# Patient Record
Sex: Female | Born: 1960 | Race: White | Hispanic: No | Marital: Married | Smoking: Current every day smoker
Health system: Southern US, Community
[De-identification: ages and names within clinical notes are randomized; demographics above are authoritative.]

## PROBLEM LIST (undated history)

## (undated) HISTORY — PX: ABDOMINAL HYSTERECTOMY: SHX81

---

## 1998-10-05 ENCOUNTER — Other Ambulatory Visit: Admission: RE | Admit: 1998-10-05 | Discharge: 1998-10-05 | Payer: Self-pay | Admitting: Obstetrics and Gynecology

## 1999-10-26 ENCOUNTER — Other Ambulatory Visit: Admission: RE | Admit: 1999-10-26 | Discharge: 1999-10-26 | Payer: Self-pay | Admitting: Obstetrics and Gynecology

## 2001-02-28 ENCOUNTER — Other Ambulatory Visit: Admission: RE | Admit: 2001-02-28 | Discharge: 2001-02-28 | Payer: Self-pay | Admitting: Obstetrics and Gynecology

## 2002-12-04 ENCOUNTER — Other Ambulatory Visit: Admission: RE | Admit: 2002-12-04 | Discharge: 2002-12-04 | Payer: Self-pay | Admitting: Obstetrics and Gynecology

## 2002-12-09 ENCOUNTER — Encounter: Payer: Self-pay | Admitting: Obstetrics and Gynecology

## 2002-12-09 ENCOUNTER — Encounter (INDEPENDENT_AMBULATORY_CARE_PROVIDER_SITE_OTHER): Payer: Self-pay | Admitting: *Deleted

## 2002-12-09 ENCOUNTER — Encounter: Admission: RE | Admit: 2002-12-09 | Discharge: 2002-12-09 | Payer: Self-pay | Admitting: Obstetrics and Gynecology

## 2004-02-07 ENCOUNTER — Other Ambulatory Visit: Admission: RE | Admit: 2004-02-07 | Discharge: 2004-02-07 | Payer: Self-pay | Admitting: Obstetrics and Gynecology

## 2004-02-10 ENCOUNTER — Encounter: Admission: RE | Admit: 2004-02-10 | Discharge: 2004-02-10 | Payer: Self-pay | Admitting: Obstetrics and Gynecology

## 2004-03-09 ENCOUNTER — Encounter (INDEPENDENT_AMBULATORY_CARE_PROVIDER_SITE_OTHER): Payer: Self-pay | Admitting: *Deleted

## 2004-03-09 ENCOUNTER — Ambulatory Visit (HOSPITAL_COMMUNITY): Admission: RE | Admit: 2004-03-09 | Discharge: 2004-03-09 | Payer: Self-pay | Admitting: General Surgery

## 2004-03-09 ENCOUNTER — Ambulatory Visit (HOSPITAL_BASED_OUTPATIENT_CLINIC_OR_DEPARTMENT_OTHER): Admission: RE | Admit: 2004-03-09 | Discharge: 2004-03-09 | Payer: Self-pay | Admitting: General Surgery

## 2005-02-16 ENCOUNTER — Other Ambulatory Visit: Admission: RE | Admit: 2005-02-16 | Discharge: 2005-02-16 | Payer: Self-pay | Admitting: Obstetrics and Gynecology

## 2005-04-17 ENCOUNTER — Encounter: Admission: RE | Admit: 2005-04-17 | Discharge: 2005-04-17 | Payer: Self-pay | Admitting: Obstetrics and Gynecology

## 2005-04-21 ENCOUNTER — Encounter: Admission: RE | Admit: 2005-04-21 | Discharge: 2005-04-21 | Payer: Self-pay | Admitting: Interventional Radiology

## 2005-05-04 ENCOUNTER — Ambulatory Visit: Admission: RE | Admit: 2005-05-04 | Discharge: 2005-05-04 | Payer: Self-pay | Admitting: Gynecology

## 2005-05-15 ENCOUNTER — Encounter (INDEPENDENT_AMBULATORY_CARE_PROVIDER_SITE_OTHER): Payer: Self-pay | Admitting: Specialist

## 2005-05-15 ENCOUNTER — Inpatient Hospital Stay (HOSPITAL_COMMUNITY): Admission: RE | Admit: 2005-05-15 | Discharge: 2005-05-17 | Payer: Self-pay | Admitting: Obstetrics and Gynecology

## 2007-04-16 ENCOUNTER — Encounter: Admission: RE | Admit: 2007-04-16 | Discharge: 2007-04-16 | Payer: Self-pay | Admitting: Obstetrics and Gynecology

## 2010-11-10 NOTE — Consult Note (Signed)
[name], [name]             ACCOUNT NO.:  [id number]   MEDICAL RECORD NO.:  [id number]          PATIENT TYPE:  OUT   LOCATION:  GYN                          FACILITY:  [facility]   PHYSICIAN:  De Blanch, M.D.DATE OF BIRTH:  1961/02/01   DATE OF CONSULTATION:  05/04/2005  DATE OF DISCHARGE:                                   CONSULTATION   GYNECOLOGIC ONCOLOGY CLINIC   REFERRING PHYSICIAN:  Juluis Mire, M.D.   CHIEF COMPLAINT:  Pelvic mass.   HISTORY OF PRESENT ILLNESS:  This is a 50 year old, white married female  seen in consultation at the request of Dr. Richardean Chimera regarding management  of large mass arising at the patient's uterus.  The patient gives a history  of approximately 1 year of pelvic pressure and 6 months of increasing  menorrhagia.  She was initially evaluated with an ultrasound.  She  subsequently was referred for uterine artery embolization.  However, an MRI  obtained in preparation for the embolization revealed a 12.4 x 10.3 x 7.3 cm  heterogeneous mass arising from the uterus which is atypical for a fibroid.  The possibility of a uterine malignancy or sarcoma was raised and  hysterectomy has been advised.   PAST MEDICAL HISTORY:  None.   PAST SURGICAL HISTORY:  1.  Right salpingo-oophorectomy for ovarian cyst during pregnancy.  2.  Breast biopsy for benign lipoma.   ALLERGIES:  PENICILLIN causes a rash.   CURRENT MEDICATIONS:  Zoloft and Ambien p.r.n.   PAST OBSTETRICAL HISTORY:  G2.  The patient has a 49 year old and a 13-year-  old.   SOCIAL HISTORY:  The patient is married.  The patient smokes less than 1  pack per day.  She works in Clinical biochemist at [employer].   FAMILY HISTORY:  The patient has a sister with metastatic melanoma.   REVIEW OF SYSTEMS:  A 10-point comprehensive review of systems is performed  and is negative except for symptoms as noted above.   PHYSICAL EXAMINATION:  VITAL SIGNS:  Weight 152 pounds,  height 5 feet 7,  blood pressure 128/74, pulse 88.  GENERAL:  The patient is a healthy, slightly anxious, white female in no  acute distress.  HEENT:  Negative.  NECK:  Supple without thyromegaly.  There is no supraclavicular or inguinal  adenopathy.  ABDOMEN:  Soft and nontender.  She has a palpable mass extending  approximately 2 cm below the umbilicus.  She has a well-healed Pfannenstiel  incision.  PELVIC:  EG/BUS.  Vagina, bladder and urethra are normal.  Cervix is  deviated anteriorly.  There is a central pelvic mass consistent with uterine  fibroid or enlarged uterus approximately 16 weeks' gestational size.  No  adnexal mass is noted.  Rectovaginal exam confirms.  EXTREMITIES:  Lower extremities without edema or varicosities.   IMPRESSION:  Atypical mass arising from the uterus either atypical fibroid  or uterine sarcoma.  I had a lengthy discussion with the patient and her  husband regarding the differential diagnosis.   RECOMMENDATIONS:  We would recommend she undergo a total abdominal  hysterectomy and intraoperative  frozen section.  They are aware that if this  is a sarcoma, additional surgical staging may be recommended or if there is  any evidence of spread resection which could include bowel resection would  be advised.  They are desirous of going ahead with surgery.  The risks of  surgery including hemorrhage, infection, injury to adjacent viscera,  thromboembolic complications and anesthetic risks were outlined.  All other  questions are answered and we will coordinate scheduling surgery with Dr.  Jonny Ruiz McComb's office.      De Blanch, M.D.  Electronically Signed     DC/MEDQ  D:  05/04/2005  T:  05/04/2005  Job:  [id number]   cc:   Juluis Mire, M.D.  Fax: [phone]   Telford Nab, R.N.  [address]  [place], [place] [address]

## 2010-11-10 NOTE — Discharge Summary (Signed)
NAMESKYELYN, [name]             ACCOUNT NO.:  [id number]   MEDICAL RECORD NO.:  [id number]          PATIENT TYPE:  INP   LOCATION:  1614                         FACILITY:  [facility]   PHYSICIAN:  Juluis Mire, M.D.   DATE OF BIRTH:  1961-05-19   DATE OF ADMISSION:  05/15/2005  DATE OF DISCHARGE:  05/17/2005                                 DISCHARGE SUMMARY   ADMISSION DIAGNOSIS:  Enlarged uterine fibroids, rule out sarcomatous  changes.   DISCHARGE DIAGNOSIS:  Apparent degenerating fibroid.   PROCEDURE:  Total abdominal hysterectomy.   HISTORY OF PRESENT ILLNESS:  For complete History and Physical, please see  the dictated note.   HOSPITAL COURSE:  The patient underwent total abdominal hysterectomy.  Initial evaluation revealed generating fibroid.  Final pathology is pending.  Postop hemoglobin 11.8.  Discharged home on postop day #2.  At that time,  she was afebrile with stable vital signs.  Abdomen was soft and nontender  with incision intact.  She was voiding without difficulty and passing  flatus.  No active vaginal bleeding.   CONDITION ON DISCHARGE:  The patient was discharged home in stable  condition.   DISPOSITION:  Routine postop instruction orders given.   ACTIVITY:  She is to avoid heavy lifting, vaginal entrance or driving of a  car.   SPECIAL INSTRUCTIONS:  She is to watch for signs of infection, nausea,  vomiting, increasing abdominal pain or active vaginal bleeding.   FOLLOW UP:  Follow up in the office in 1 week.      Juluis Mire, M.D.  Electronically Signed     JSM/MEDQ  D:  05/17/2005  T:  05/17/2005  Job:  [id number]

## 2010-11-10 NOTE — Op Note (Signed)
NAME:  [name], [name]                       ACCOUNT NO.:  [id number]   MEDICAL RECORD NO.:  [id number]                   PATIENT TYPE:  AMB   LOCATION:  DSC                                  FACILITY:  [facility]   PHYSICIAN:  Rose Phi. Maple Hudson, M.D.                DATE OF BIRTH:  03/21/1961   DATE OF PROCEDURE:  03/09/2004  DATE OF DISCHARGE:                                 OPERATIVE REPORT   PREOPERATIVE DIAGNOSES:  1.  Fibroadenoma of the left breast.  2.  Small cyst of the left breast.   POSTOPERATIVE DIAGNOSES:  1.  Fibroadenoma of the left breast.  2.  Small cyst of the left breast.   OPERATION PERFORMED:  1.  Excision of left breast mass.  2.  Excision of cyst of the face.   SURGEON:  Rose Phi. Maple Hudson, M.D.   ANESTHESIA:  General.   DESCRIPTION OF PROCEDURE:  After suitable general anesthesia was induced,  the patient was placed in the supine position and the left breast prepped  and draped in the usual fashion.  We also used a local anesthetic mixture  and I injected that as we made a circumareolar incision centered at the 12  o'clock position for the palpable mass that was at 12 o'clock on the left  side.  After making the incision, I exposed the fibroadenoma and excised it.  Hemostasis obtained with the cautery.  Subcuticular closure with 4-0  Monocryl and Steri-Strips carried out.  Dressing applied.   We then prepped the little area of the face where the cyst was lateral to  the mouth and a small incision was made to excise it.  This gave about a 1.5  x 0.5 cm defect and it was closed in a single layer of interrupted nylon  sutures.  Dressings were applied.  The patient was then transferred to the  recovery room in satisfactory condition, having tolerated the procedure  well.                                               Rose Phi. Maple Hudson, M.D.    PRY/MEDQ  D:  03/09/2004  T:  03/09/2004  Job:  [id number]

## 2010-11-10 NOTE — Op Note (Signed)
NAMEKELSEE, PRESLAR             ACCOUNT NO.:  [id number]   MEDICAL RECORD NO.:  [id number]          PATIENT TYPE:  INP   LOCATION:  1614                         FACILITY:  [facility]   PHYSICIAN:  De Blanch, M.D.DATE OF BIRTH:  Jun 13, 1961   DATE OF PROCEDURE:  05/15/2005  DATE OF DISCHARGE:                                 OPERATIVE REPORT   PREOPERATIVE DIAGNOSIS:  Uterine fibroids which are rapidly enlarging, rule  out uterine sarcoma.   POSTOPERATIVE DIAGNOSIS:  Fibroid uterus (pending final pathology).   PROCEDURE:  Exploratory laparotomy, total abdominal hysterectomy, right  salpingo-oophorectomy.   SURGEON:  De Blanch, M.D.   FIRST ASSISTANT:  Juluis Mire, M.D., Telford Nab, R.N.   ANESTHESIA:  General orotracheal tube.   ESTIMATED BLOOD LOSS:  125 cc.   SURGICAL FINDINGS:  At time of exploratory laparotomy, the upper abdomen was  normal. The uterus was enlarged approximately [redacted] weeks gestational size and  was very soft. The left tube and ovary appeared normal. The right tube  appeared normal. The right ovary had previously been removed. On frozen  section, we were told this was a benign uterine fibroid with no increased  number of mitoses.   PROCEDURE:  The patient brought to the operating room, and after  satisfactory attainment of general anesthesia, was placed in modified  lithotomy position in Nimmons stirrups. Anterior abdominal wall, perineum and  vagina were prepped with Betadine. A Foley catheter was inserted. The  patient was draped. The abdomen was entered through a previous Pfannenstiel  incision. Peritoneal washings were obtained from the pelvis. The upper  abdomen and pelvis were explored with the above-noted findings. Bookwalter  retractor was positioned with care taken to avoid compression of the psoas  muscle and femoral nerve. The bowel was packed out of the pelvis. The round  ligament was grasped with a Kelly clamp and  divided. The retroperitoneal  space on the right was opened, identifying the vessels and ureter. The  ovarian vessels were skeletonized, clamped, cut, free tied and suture  ligated. The peritoneum of the posterior aspect of the broad ligament was  further incised.   The left round ligament was grasped large Kelly clamp, divided and left  retroperitoneal space opened. The ureter was identified. The ovarian vessels  were identified. The patient desired to preserve her ovary, and therefore,  the uterine ovarian anastomosis, the ovarian ligament, and fallopian tube  were cross clamped and divided, suture ligated and free tied, thus  preserving the left tube and ovary. The left posterior broad ligament was  further incised. Bladder flap was incised and advanced with sharp and blunt  dissection. Uterine vessels were skeletonized, clamped, cut and suture  ligated in a stepwise fashion. The paracervical and cardinal ligaments were  clamped, cut and suture ligated. The vaginal angles were cross clamped and  divided. The cervix was transected from its junction with the vagina. The  uterus, cervix and the right fallopian tube were submitted to pathology for  frozen section with the above-noted findings. The vaginal angles were  transfixed with 0 Vicryl. Central portion of vagina closed with  interrupted  figure-of-eight sutures of 0 Vicryl. The pelvis was irrigated and found to  be hemostatic. Frozen section returned indicating a benign ovarian benign  fibroid uterus. The retractors and packs were removed. The anterior  abdominal wall was closed in layers, the first being a running suture of 0  Vicryl on the peritoneum. The subfascial area and rectus muscle were  inspected and found to be hemostatic. The fascia was closed with a running  suture of #1 PDS. Subcutaneous tissue was irrigated, and skin closed with  skin staples. A dressing was applied. The patient was awakened from  anesthesia and taken  to the recovery room in satisfactory condition. Sponge,  needle and instrument counts correct x2.      De Blanch, M.D.  Electronically Signed     DC/MEDQ  D:  05/15/2005  T:  05/15/2005  Job:  [id number]   cc:   Telford Nab, R.N.  [address]  [place], [place] [address]   Juluis Mire, M.D.  Fax: [phone]

## 2011-05-29 ENCOUNTER — Other Ambulatory Visit: Payer: Self-pay | Admitting: Obstetrics and Gynecology

## 2011-05-29 DIAGNOSIS — N63 Unspecified lump in unspecified breast: Secondary | ICD-10-CM

## 2011-06-12 ENCOUNTER — Ambulatory Visit
Admission: RE | Admit: 2011-06-12 | Discharge: 2011-06-12 | Disposition: A | Discharge status: Home / Self Care | Payer: 59 | Source: Ambulatory Visit | Attending: Obstetrics and Gynecology | Admitting: Obstetrics and Gynecology

## 2011-06-12 DIAGNOSIS — N63 Unspecified lump in unspecified breast: Secondary | ICD-10-CM

## 2013-09-17 ENCOUNTER — Other Ambulatory Visit: Payer: Self-pay | Admitting: Obstetrics and Gynecology

## 2013-09-17 DIAGNOSIS — R928 Other abnormal and inconclusive findings on diagnostic imaging of breast: Secondary | ICD-10-CM

## 2013-09-24 ENCOUNTER — Ambulatory Visit
Admission: RE | Admit: 2013-09-24 | Discharge: 2013-09-24 | Disposition: A | Discharge status: Home / Self Care | Payer: PRIVATE HEALTH INSURANCE | Source: Ambulatory Visit | Attending: Obstetrics and Gynecology | Admitting: Obstetrics and Gynecology

## 2013-09-24 DIAGNOSIS — R928 Other abnormal and inconclusive findings on diagnostic imaging of breast: Secondary | ICD-10-CM

## 2014-10-27 ENCOUNTER — Other Ambulatory Visit: Payer: Self-pay | Admitting: Obstetrics and Gynecology

## 2014-10-27 DIAGNOSIS — N6320 Unspecified lump in the left breast, unspecified quadrant: Principal | ICD-10-CM

## 2014-10-27 DIAGNOSIS — N6310 Unspecified lump in the right breast, unspecified quadrant: Secondary | ICD-10-CM

## 2014-11-04 ENCOUNTER — Ambulatory Visit
Admission: RE | Admit: 2014-11-04 | Discharge: 2014-11-04 | Disposition: A | Discharge status: Home / Self Care | Payer: PRIVATE HEALTH INSURANCE | Source: Ambulatory Visit | Attending: Obstetrics and Gynecology | Admitting: Obstetrics and Gynecology

## 2014-11-04 DIAGNOSIS — N6310 Unspecified lump in the right breast, unspecified quadrant: Secondary | ICD-10-CM

## 2014-11-04 DIAGNOSIS — N6320 Unspecified lump in the left breast, unspecified quadrant: Principal | ICD-10-CM

## 2018-11-28 ENCOUNTER — Emergency Department (HOSPITAL_COMMUNITY): Payer: 59

## 2018-11-28 ENCOUNTER — Emergency Department (HOSPITAL_COMMUNITY)
Admission: EM | Admit: 2018-11-28 | Discharge: 2018-11-28 | Disposition: A | Discharge status: Home / Self Care | Payer: 59 | Attending: Emergency Medicine | Admitting: Emergency Medicine

## 2018-11-28 ENCOUNTER — Encounter (HOSPITAL_COMMUNITY): Payer: Self-pay

## 2018-11-28 ENCOUNTER — Other Ambulatory Visit: Payer: Self-pay

## 2018-11-28 DIAGNOSIS — K921 Melena: Secondary | ICD-10-CM | POA: Diagnosis not present

## 2018-11-28 DIAGNOSIS — F17210 Nicotine dependence, cigarettes, uncomplicated: Secondary | ICD-10-CM | POA: Insufficient documentation

## 2018-11-28 DIAGNOSIS — R197 Diarrhea, unspecified: Secondary | ICD-10-CM | POA: Diagnosis not present

## 2018-11-28 DIAGNOSIS — R112 Nausea with vomiting, unspecified: Secondary | ICD-10-CM | POA: Insufficient documentation

## 2018-11-28 DIAGNOSIS — K529 Noninfective gastroenteritis and colitis, unspecified: Secondary | ICD-10-CM | POA: Diagnosis not present

## 2018-11-28 DIAGNOSIS — K625 Hemorrhage of anus and rectum: Secondary | ICD-10-CM | POA: Diagnosis present

## 2018-11-28 LAB — COMPREHENSIVE METABOLIC PANEL
ALT: 17 U/L (ref 0–44)
AST: 17 U/L (ref 15–41)
Albumin: 4.4 g/dL (ref 3.5–5.0)
Alkaline Phosphatase: 79 U/L (ref 38–126)
Anion gap: 6 (ref 5–15)
BUN: 13 mg/dL (ref 6–20)
CO2: 26 mmol/L (ref 22–32)
Calcium: 9.3 mg/dL (ref 8.9–10.3)
Chloride: 104 mmol/L (ref 98–111)
Creatinine, Ser: 0.66 mg/dL (ref 0.44–1.00)
GFR calc Af Amer: >60 mL/min (ref >60)
GFR calc non Af Amer: >60 mL/min (ref >60)
Glucose, Bld: 99 mg/dL (ref 70–99)
Potassium: 3.5 mmol/L (ref 3.5–5.1)
Sodium: 136 mmol/L (ref 135–145)
Total Bilirubin: 0.7 mg/dL (ref 0.3–1.2)
Total Protein: 7.6 g/dL (ref 6.5–8.1)

## 2018-11-28 LAB — CBC WITH DIFFERENTIAL/PLATELET
Abs Immature Granulocytes: 0.02 10*3/uL (ref 0.00–0.07)
Basophils Absolute: 0.1 10*3/uL (ref 0.0–0.1)
Basophils Relative: 1 %
Eosinophils Absolute: 0.1 10*3/uL (ref 0.0–0.5)
Eosinophils Relative: 1 %
HCT: 42.8 % (ref 36.0–46.0)
Hemoglobin: 14.2 g/dL (ref 12.0–15.0)
Immature Granulocytes: 0 %
Lymphocytes Relative: 28 %
Lymphs Abs: 2.6 10*3/uL (ref 0.7–4.0)
MCH: 32 pg (ref 26.0–34.0)
MCHC: 33.2 g/dL (ref 30.0–36.0)
MCV: 96.4 fL (ref 80.0–100.0)
Monocytes Absolute: 0.6 10*3/uL (ref 0.1–1.0)
Monocytes Relative: 7 %
Neutro Abs: 5.9 10*3/uL (ref 1.7–7.7)
Neutrophils Relative %: 63 %
Platelets: 301 10*3/uL (ref 150–400)
RBC: 4.44 MIL/uL (ref 3.87–5.11)
RDW: 12.8 % (ref 11.5–15.5)
WBC: 9.3 10*3/uL (ref 4.0–10.5)
nRBC: 0 % (ref 0.0–0.2)

## 2018-11-28 LAB — LIPASE, BLOOD: Lipase: 60 U/L — ABNORMAL HIGH (ref 11–51)

## 2018-11-28 MED ORDER — IOPAMIDOL (ISOVUE-300) INJECTION 61%
INTRAVENOUS | Status: AC
  Filled 2018-11-28: qty 150

## 2018-11-28 MED ORDER — SODIUM CHLORIDE (PF) 0.9 % IJ SOLN
INTRAMUSCULAR | Status: AC
  Filled 2018-11-28: qty 50

## 2018-11-28 MED ORDER — IOHEXOL 300 MG/ML  SOLN
100.0000 mL | Freq: Once | INTRAMUSCULAR | Status: AC | PRN
  Administered 2018-11-28: 100 mL via INTRAVENOUS

## 2018-11-28 MED ORDER — SODIUM CHLORIDE 0.9 % IV BOLUS
500.0000 mL | Freq: Once | INTRAVENOUS | Status: AC
  Administered 2018-11-28: 500 mL via INTRAVENOUS

## 2018-11-28 NOTE — ED Notes (Signed)
Discharge instructions reviewed with patient. Patient verbalizes understanding and has no questions at this time.

## 2018-11-28 NOTE — ED Notes (Signed)
Patient transported to CT 

## 2018-11-28 NOTE — ED Triage Notes (Signed)
Pt states she woke up at 0130 Thursday with "violent" emesis and diarrhea. Pt states since then she has not had any more diarrhea, but has not eaten since. Pt states hourly she wakes up and passes frank red blood . Last episode at 0930.  Pt describes it as a spoonful, and the toilet paper is bright red, without any feces. Pt describes lower abd discomfort, more on the right than left. Pt did Doc on demand, who sent her to UC, and then to here.

## 2018-11-28 NOTE — ED Provider Notes (Signed)
[facility]-EMERGENCY DEPT Provider Note   CSN: [id number] Arrival date & time: 11/28/18  1440    History   Chief Complaint Chief Complaint  Patient presents with   GI Bleeding    HPI [name] is a 58 y.o. female.     The history is provided by the patient and medical records. No language interpreter was used.   [name] is a 58 y.o. female who presents to the Emergency Department complaining of rectal bleeding. She presents to the emergency department for evaluation of hematochezia. She was in her routine state of health until she woke up yesterday morning around three in the morning with violent vomiting and diarrhea. She states her emesis and diarrhea were normal in color and she was having episodes every 30 minutes to one hour throughout the day. Gradually her symptoms did slow down and she was able to sleep in the afternoon. She had a temperature to 99. Yesterday afternoon she developed hematochezia with episodes occurring about every hour. She would have the sensation she needed to have a bowel movement and only bright red blood would come out. Her last episode was at 930 this morning. Today she feels significantly improved and her nausea and vomiting have resolved. She has been able to eat and drink today. She does have abdominal soreness on the left and right side. She went to urgent care today and was referred to the emergency department for further evaluation. She states that bright red blood was found on her rectal exam urgent care. She has a history of melanoma, status post resection several years ago. She takes Zoloft. She does have a history of colonoscopy one year ago and states that there were no abnormalities at that time. No recent travel. No known bad food exposures or sick contacts. She had [place] enchiladas for dinner last night and her husband the same thing he did not become sick. No well water. Has not been drinking from lakes or  streams. She does have two dogs. History reviewed. No pertinent past medical history.  There are no active problems to display for this patient.   Past Surgical History:  Procedure Laterality Date   ABDOMINAL HYSTERECTOMY       OB History   No obstetric history on file.      Home Medications    Prior to Admission medications   Not on File    Family History No family history on file.  Social History Social History   Tobacco Use   Smoking status: Current Every Day Smoker    Packs/day: 0.50    Types: Cigarettes   Smokeless tobacco: Never Used  Substance Use Topics   Alcohol use: Yes    Frequency: Never    Comment: seldom   Drug use: Not on file     Allergies   Codeine and Penicillin g   Review of Systems Review of Systems  All other systems reviewed and are negative.    Physical Exam Updated Vital Signs BP 110/70    Pulse 67    Temp 98.4 F (36.9 C) (Oral)    Resp 18    Ht 5\' 7"  (1.702 m)    Wt 57.6 kg    SpO2 100%    BMI 19.89 kg/m   Physical Exam Vitals signs and nursing note reviewed.  Constitutional:      Appearance: She is well-developed.  HENT:     Head: Normocephalic and atraumatic.  Cardiovascular:     Rate  and Rhythm: Normal rate and regular rhythm.     Heart sounds: No murmur.  Pulmonary:     Effort: Pulmonary effort is normal. No respiratory distress.     Breath sounds: Normal breath sounds.  Abdominal:     Palpations: Abdomen is soft.     Tenderness: There is no guarding or rebound.     Comments: Moderate LLQ tenderness, mild RLQ tenderness.    Genitourinary:    Comments: Nontender rectal examination. No external or internal hemorrhoids palpated. No gross blood. Musculoskeletal:        General: No swelling or tenderness.  Skin:    General: Skin is warm and dry.  Neurological:     Mental Status: She is alert and oriented to person, place, and time.  Psychiatric:        Behavior: Behavior normal.      ED Treatments /  Results  Labs (all labs ordered are listed, but only abnormal results are displayed) Labs Reviewed  LIPASE, BLOOD - Abnormal; Notable for the following components:      Result Value   Lipase 60 (*)    All other components within normal limits  GASTROINTESTINAL PANEL BY PCR, STOOL (REPLACES STOOL CULTURE)  COMPREHENSIVE METABOLIC PANEL  CBC WITH DIFFERENTIAL/PLATELET    EKG None  Radiology Ct Abdomen Pelvis W Contrast  Result Date: 11/28/2018 CLINICAL DATA:  Emesis and diarrhea, bright red blood per rectum EXAM: CT ABDOMEN AND PELVIS WITH CONTRAST TECHNIQUE: Multidetector CT imaging of the abdomen and pelvis was performed using the standard protocol following bolus administration of intravenous contrast. CONTRAST:  OMNIPAQUE IOHEXOL 300 MG/ML  SOLN COMPARISON:  None. FINDINGS: Lower chest: No acute abnormality. Hepatobiliary: No solid liver abnormality is seen. No gallstones, gallbladder wall thickening, or biliary dilatation. Pancreas: Unremarkable. No pancreatic ductal dilatation or surrounding inflammatory changes. Spleen: Normal in size without significant abnormality. Adrenals/Urinary Tract: Adrenal glands are unremarkable. Kidneys are normal, without renal calculi, solid lesion, or hydronephrosis. Bladder is unremarkable. Stomach/Bowel: There is thickening of the descending colonic mucosa (series 2, image 46, series 5, image 121). Appendix appears normal. No evidence of bowel wall thickening, distention, or inflammatory changes. Vascular/Lymphatic: Scattered atherosclerosis. No enlarged abdominal or pelvic lymph nodes. Reproductive: No mass or other significant abnormality. Status post hysterectomy. Other: No abdominal wall hernia or abnormality. No abdominopelvic ascites. Musculoskeletal: No acute or significant osseous findings. IMPRESSION: There is thickening of the descending colonic mucosa (series 2, image 46, series 5, image 121), consistent with nonspecific infectious,  inflammatory, or ischemic colitis. Electronically Signed   By: Lauralyn Primes M.D.   On: 11/28/2018 17:41    Procedures Procedures (including critical care time)  Medications Ordered in ED Medications  sodium chloride 0.9 % bolus 500 mL (0 mLs Intravenous Stopped 11/28/18 1710)  iohexol (OMNIPAQUE) 300 MG/ML solution 100 mL (100 mLs Intravenous Contrast Given 11/28/18 1717)     Initial Impression / Assessment and Plan / ED Course  I have reviewed the triage vital signs and the nursing notes.  Pertinent labs & imaging results that were available during my care of the patient were reviewed by me and considered in my medical decision making (see chart for details).        Patient here for evaluation of hematochezia. No gross blood on rectal examination but she did have blood on her examination in urgent care prior to ED arrival. She does have tenderness on examination and CT abdomen pelvis was obtained. CT is consistent with colitis. CBC is  reassuring with normal hemoglobin. Discussed with patient home care for colitis. Presentation is not consistent with ischemic colitis. Presentation is not consistent with serious bacterial infection at this time. Patient is unable to provide a stool sample in the emergency department. Discussed with patient home care for colitis. Discussed outpatient follow-up as well as return precautions.  Final Clinical Impressions(s) / ED Diagnoses   Final diagnoses:  Colitis  Hematochezia    ED Discharge Orders    None       Tilden Fossaees, Keundra Petrucelli, MD 11/28/18 2342

## 2019-09-05 ENCOUNTER — Ambulatory Visit: Payer: PRIVATE HEALTH INSURANCE | Attending: Internal Medicine

## 2019-09-05 DIAGNOSIS — Z23 Encounter for immunization: Secondary | ICD-10-CM

## 2019-09-05 NOTE — Progress Notes (Signed)
   Covid-19 Vaccination Clinic  Name:  [name]    MRN: [id number] DOB: [birth year]  09/05/2019  [name] was observed post Covid-19 immunization for 15 minutes without incident. She was provided with Vaccine Information Sheet and instruction to access the V-Safe system.   [name] was instructed to call 911 with any severe reactions post vaccine: Marland Kitchen Difficulty breathing  . Swelling of face and throat  . A fast heartbeat  . A bad rash all over body  . Dizziness and weakness   Immunizations Administered    Name Date Dose VIS Date Route   Pfizer COVID-19 Vaccine 09/05/2019  8:18 AM 0.3 mL 06/05/2019 Intramuscular   Manufacturer: [employer], [employer]   Lot: [id number]   NDC: [address]-3

## 2019-09-28 ENCOUNTER — Ambulatory Visit: Payer: PRIVATE HEALTH INSURANCE | Attending: Internal Medicine

## 2019-09-28 ENCOUNTER — Ambulatory Visit: Payer: PRIVATE HEALTH INSURANCE

## 2019-09-28 DIAGNOSIS — Z23 Encounter for immunization: Secondary | ICD-10-CM

## 2019-09-28 NOTE — Progress Notes (Signed)
   Covid-19 Vaccination Clinic  Name:  [name]    MRN: [id number] DOB: 11-10-1960  09/28/2019  [name] was observed post Covid-19 immunization for 15 minutes without incident. She was provided with Vaccine Information Sheet and instruction to access the V-Safe system.   [name] was instructed to call 911 with any severe reactions post vaccine: Marland Kitchen Difficulty breathing  . Swelling of face and throat  . A fast heartbeat  . A bad rash all over body  . Dizziness and weakness   Immunizations Administered    Name Date Dose VIS Date Route   Pfizer COVID-19 Vaccine 09/28/2019  4:15 PM 0.3 mL 06/05/2019 Intramuscular   Manufacturer: [employer], [employer]   Lot: [id number]   NDC: [address]-5

## 2021-05-22 IMAGING — CT CT ABDOMEN AND PELVIS WITH CONTRAST
2 of 5 series · 16 of 46 positions shown, 18 images · IV contrast (ISOVUE)
Comparison: None.

CLINICAL DATA: Emesis and diarrhea, bright red blood per rectum

EXAM:
CT ABDOMEN AND PELVIS WITH CONTRAST
TECHNIQUE: Multidetector CT imaging of the abdomen and pelvis was performed
using the standard protocol following bolus administration of
intravenous contrast.
CONTRAST:  100mL OMNIPAQUE IOHEXOL 300 MG/ML  SOLN

[Series 2: axial st · axial · 0.67mm/px · z∈[-513,-133]mm · 13 of 88 slices shown, 15 images]
[im 6/88  soft-tissue]
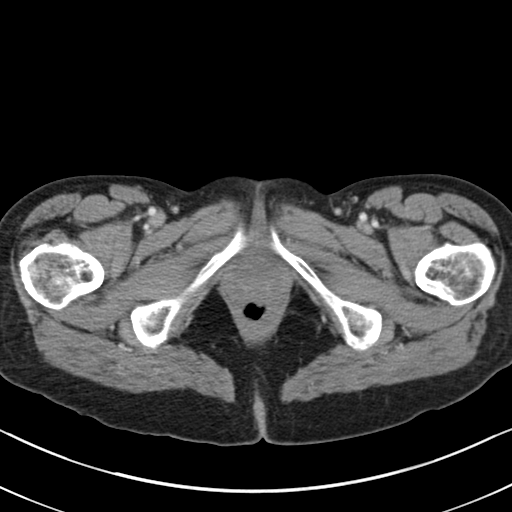
[im 6/88  bone]
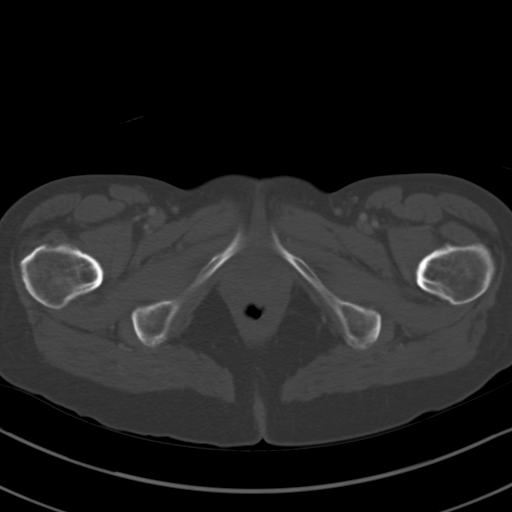
[im 12/88  soft-tissue]
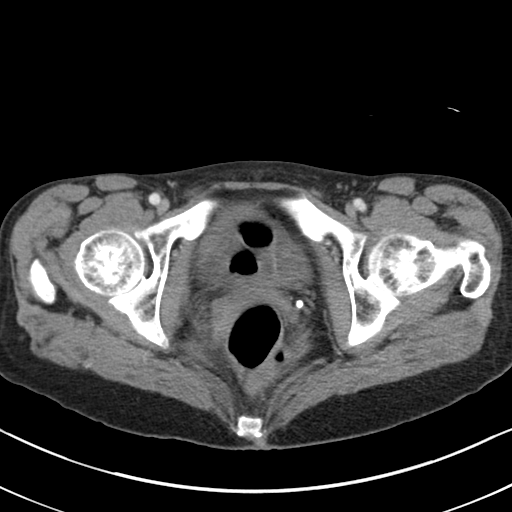
[im 18/88  soft-tissue]
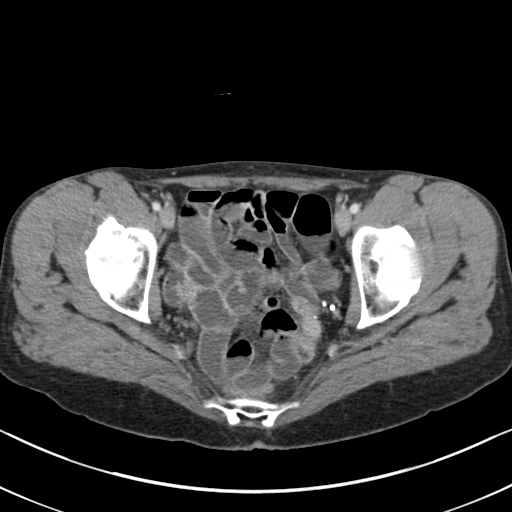
[im 24/88  soft-tissue]
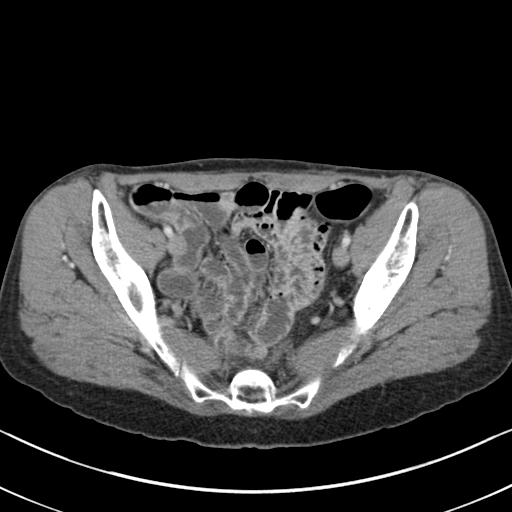
[im 30/88  soft-tissue]
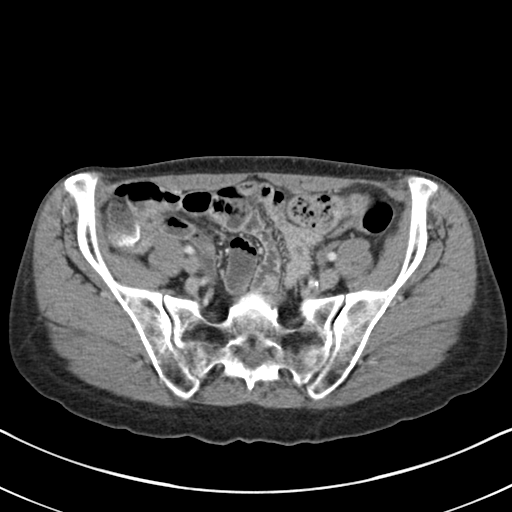
[im 35/88  soft-tissue]
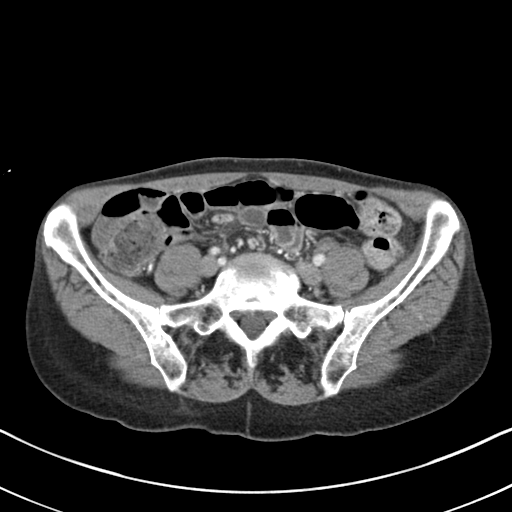
[im 47/88  soft-tissue]
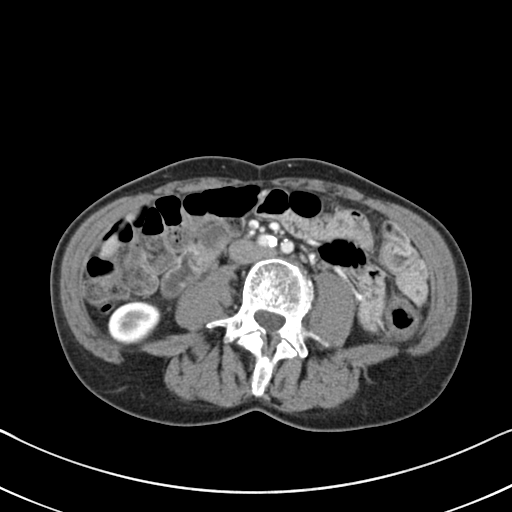
[im 53/88  soft-tissue]
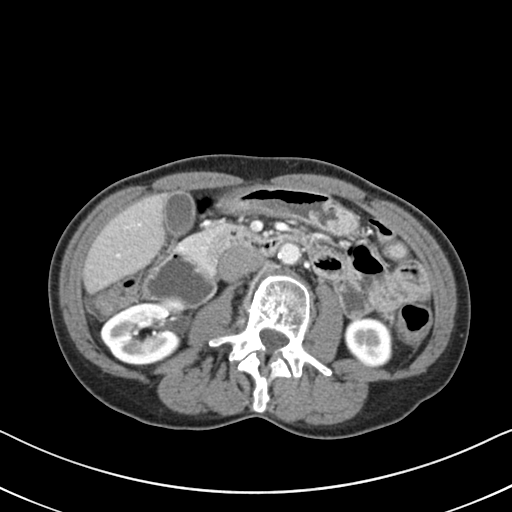
[im 59/88  soft-tissue]
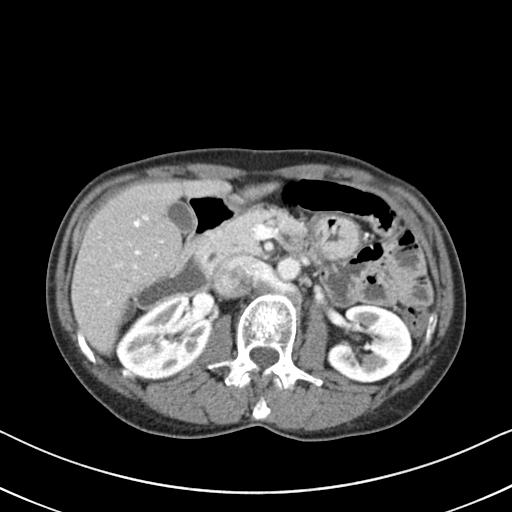
[im 59/88  bone]
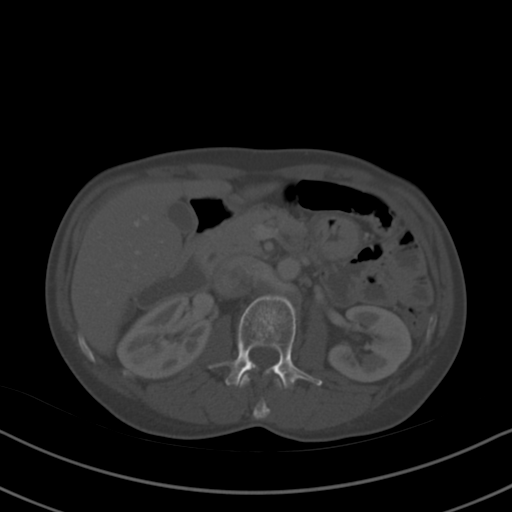
[im 64/88  soft-tissue]
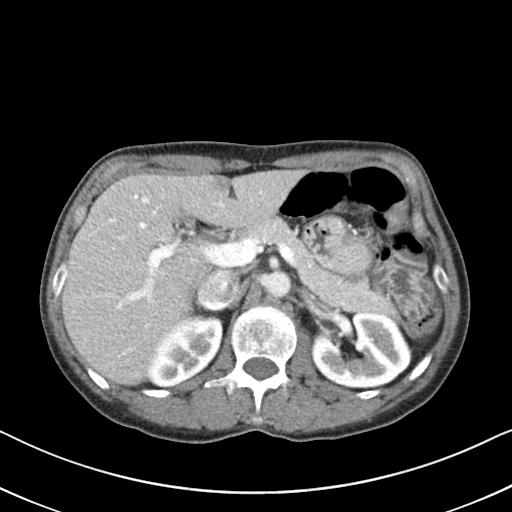
[im 70/88  soft-tissue]
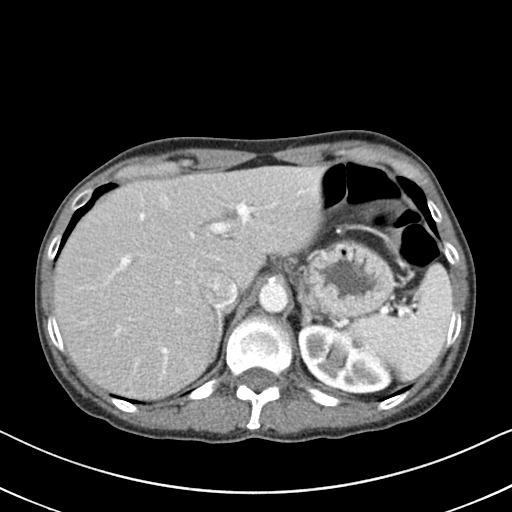
[im 76/88  soft-tissue]
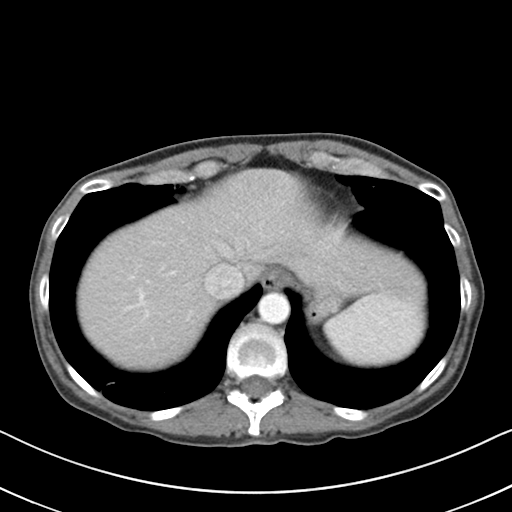
[im 82/88  soft-tissue]
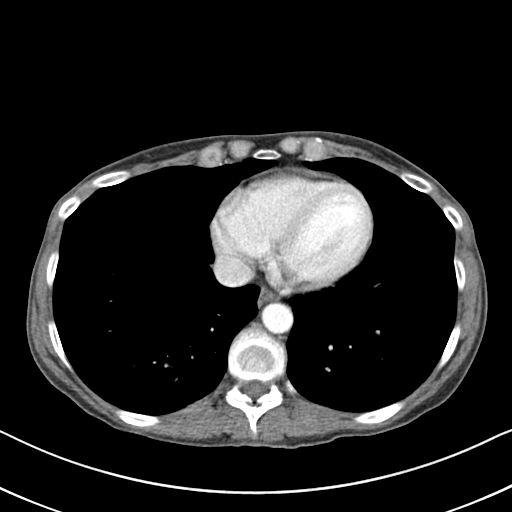

[Series 4: coronal st · coronal · 0.68mm/px · 3 of 109 slices shown]
[im 37/109  soft-tissue]
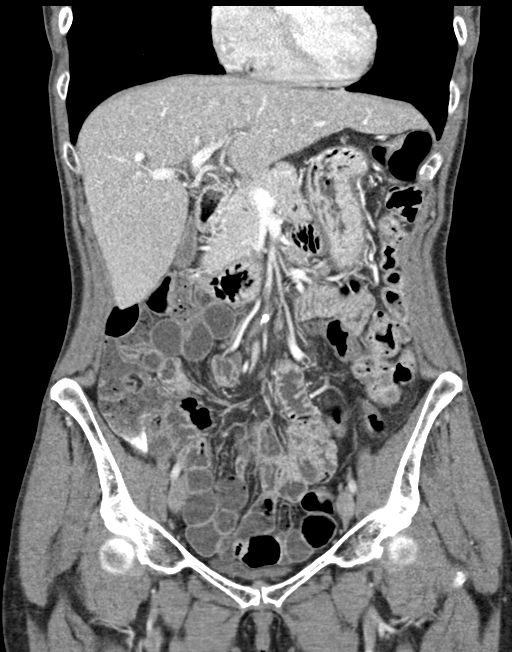
[im 49/109  soft-tissue]
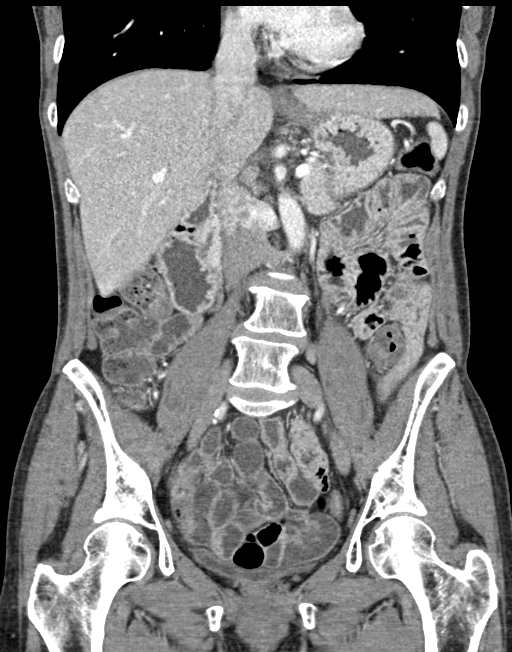
[im 61/109  soft-tissue]
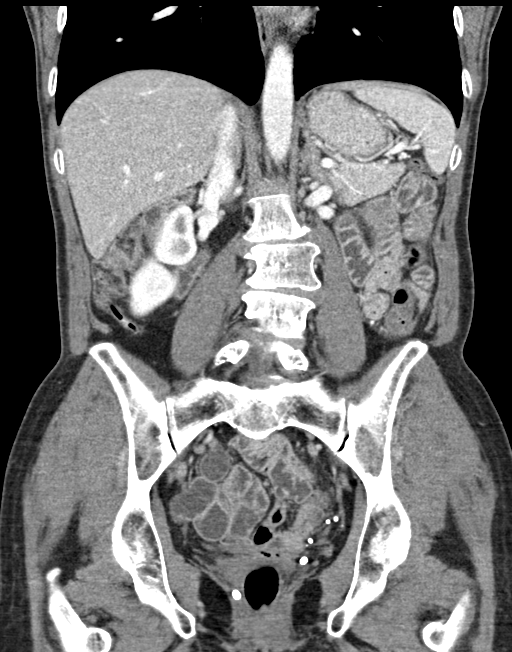

[16 of 46 positions shown; findings below may reference images not displayed]

FINDINGS: Lower chest: No acute abnormality.

Hepatobiliary: No solid liver abnormality is seen. No gallstones,
gallbladder wall thickening, or biliary dilatation.

Pancreas: Unremarkable. No pancreatic ductal dilatation or
surrounding inflammatory changes.

Spleen: Normal in size without significant abnormality.

Adrenals/Urinary Tract: Adrenal glands are unremarkable. Kidneys are
normal, without renal calculi, solid lesion, or hydronephrosis.
Bladder is unremarkable.

Stomach/Bowel: There is thickening of the descending colonic mucosa
(series 2, image 46, series 5, image 121). Appendix appears normal.
No evidence of bowel wall thickening, distention, or inflammatory
changes.

Vascular/Lymphatic: Scattered atherosclerosis. No enlarged abdominal
or pelvic lymph nodes.

Reproductive: No mass or other significant abnormality. Status post
hysterectomy.

Other: No abdominal wall hernia or abnormality. No abdominopelvic
ascites.

Musculoskeletal: No acute or significant osseous findings.
IMPRESSION: There is thickening of the descending colonic mucosa (series 2,
image 46, series 5, image 121), consistent with nonspecific
infectious, inflammatory, or ischemic colitis.

## 2022-03-09 ENCOUNTER — Other Ambulatory Visit: Payer: Self-pay

## 2022-03-09 ENCOUNTER — Emergency Department (HOSPITAL_BASED_OUTPATIENT_CLINIC_OR_DEPARTMENT_OTHER)
Admission: EM | Admit: 2022-03-09 | Discharge: 2022-03-10 | Disposition: A | Discharge status: Home / Self Care | Payer: BC Managed Care – PPO | Attending: Emergency Medicine | Admitting: Emergency Medicine

## 2022-03-09 ENCOUNTER — Other Ambulatory Visit (HOSPITAL_BASED_OUTPATIENT_CLINIC_OR_DEPARTMENT_OTHER): Payer: BC Managed Care – PPO

## 2022-03-09 ENCOUNTER — Encounter (HOSPITAL_BASED_OUTPATIENT_CLINIC_OR_DEPARTMENT_OTHER): Payer: Self-pay

## 2022-03-09 ENCOUNTER — Emergency Department (HOSPITAL_BASED_OUTPATIENT_CLINIC_OR_DEPARTMENT_OTHER): Payer: BC Managed Care – PPO | Admitting: Radiology

## 2022-03-09 DIAGNOSIS — S4992XA Unspecified injury of left shoulder and upper arm, initial encounter: Secondary | ICD-10-CM | POA: Diagnosis present

## 2022-03-09 DIAGNOSIS — M25512 Pain in left shoulder: Secondary | ICD-10-CM | POA: Diagnosis not present

## 2022-03-09 DIAGNOSIS — S42292A Other displaced fracture of upper end of left humerus, initial encounter for closed fracture: Secondary | ICD-10-CM

## 2022-03-09 DIAGNOSIS — W548XXA Other contact with dog, initial encounter: Secondary | ICD-10-CM | POA: Insufficient documentation

## 2022-03-09 DIAGNOSIS — Y93K1 Activity, walking an animal: Secondary | ICD-10-CM | POA: Diagnosis not present

## 2022-03-09 MED ORDER — KETOROLAC TROMETHAMINE 15 MG/ML IJ SOLN
15.0000 mg | Freq: Once | INTRAMUSCULAR | Status: AC
  Administered 2022-03-09: 15 mg via INTRAVENOUS
  Filled 2022-03-09: qty 1

## 2022-03-09 MED ORDER — ACETAMINOPHEN 500 MG PO TABS
1000.0000 mg | ORAL_TABLET | Freq: Once | ORAL | Status: AC
  Administered 2022-03-09: 1000 mg via ORAL
  Filled 2022-03-09: qty 2

## 2022-03-09 MED ORDER — ONDANSETRON HCL 4 MG/2ML IJ SOLN
4.0000 mg | Freq: Once | INTRAMUSCULAR | Status: AC
  Administered 2022-03-09: 4 mg via INTRAVENOUS
  Filled 2022-03-09: qty 2

## 2022-03-09 MED ORDER — FENTANYL CITRATE PF 50 MCG/ML IJ SOSY
25.0000 ug | PREFILLED_SYRINGE | Freq: Once | INTRAMUSCULAR | Status: DC
  Filled 2022-03-09: qty 1

## 2022-03-09 NOTE — ED Triage Notes (Signed)
Pt was walking her dog and pulled down, deformity L shoulder and humerus. Pt nauseated, did not hit head.

## 2022-03-10 MED ORDER — HYDROCODONE-ACETAMINOPHEN 5-325 MG PO TABS: 1.0000 | ORAL_TABLET | Freq: Four times a day (QID) | ORAL | 0 refills | Status: AC | PRN

## 2022-03-10 MED ORDER — HYDROCODONE-ACETAMINOPHEN 5-325 MG PO TABS
1.0000 | ORAL_TABLET | Freq: Once | ORAL | Status: AC
  Administered 2022-03-10: 1 via ORAL
  Filled 2022-03-10: qty 1

## 2022-03-10 NOTE — ED Notes (Signed)
Discharge instructions discussed with pt. Pt verbalized understanding. Pt stable and ambulatory.  °

## 2022-03-10 NOTE — ED Provider Notes (Signed)
[facility] EMERGENCY DEPT Provider Note   CSN: [id number] Arrival date & time: 03/09/22  2027     History  Chief Complaint  Patient presents with   [name] is a 61 y.o. female.  The history is provided by the patient, a relative and the spouse.  Fall This is a new problem. Pertinent negatives include no headaches. Nothing relieves the symptoms.   Patient presents after accidental fall.  She was trying to walk her 100 pound dog when he pulled her on the leash and she fell down injuring her left shoulder.  No head injury or LOC.  She does report nausea.  She also became anxious and had a large bout of diarrhea.  No vomiting.  No chest pain. She is not on anticoagulation.    Home Medications Prior to Admission medications   Medication Sig Start Date End Date Taking? Authorizing Provider  HYDROcodone-acetaminophen (NORCO/VICODIN) 5-325 MG tablet Take 1 tablet by mouth every 6 (six) hours as needed for severe pain. 03/10/22  Yes Zadie Rhine, MD      Allergies    Codeine and Penicillin g    Review of Systems   Review of Systems  Neurological:  Negative for headaches.    Physical Exam Updated Vital Signs BP 107/61   Pulse 88   Temp (!) 97 F (36.1 C)   Resp 16   Ht 1.702 m (5\' 7" )   Wt 62.6 kg   SpO2 95%   BMI 21.61 kg/m  Physical Exam CONSTITUTIONAL: Well developed/well nourished HEAD: Normocephalic/atraumatic, no visible trauma EYES: EOMI/PERRL ENMT: Mucous membranes moist NECK: supple no meningeal signs SPINE/BACK:entire spine nontender No bruising/crepitance/stepoffs noted to spine CV: S1/S2 noted, no murmurs/rubs/gallops noted LUNGS: Lungs are clear to auscultation bilaterally, no apparent distress ABDOMEN: soft, nontender NEURO: Pt is awake/alert/appropriate, moves all extremitiesx4.  No facial droop.  GCS 15 Patient able to make equal handgrips.  No sensory deficit noted to the left upper extremity EXTREMITIES: pulses  normal/equal, tenderness and swelling noted to the left shoulder.  Distal pulses equal and intact.  Abrasion and tenderness to left wrist All other extremities/joints palpated/ranged and nontender SKIN: warm, color normal PSYCH: no abnormalities of mood noted, alert and oriented to situation  ED Results / Procedures / Treatments   Labs (all labs ordered are listed, but only abnormal results are displayed) Labs Reviewed - No data to display  EKG None  Radiology DG Forearm Left  Result Date: 03/09/2022 CLINICAL DATA:  Fall, arm pain EXAM: LEFT FOREARM - 2 VIEW COMPARISON:  None Available. FINDINGS: No fracture or dislocation is seen. The joint spaces are preserved. The visualized soft tissues are unremarkable. IMPRESSION: Negative. Electronically Signed   By: 03/11/2022 M.D.   On: 03/09/2022 23:54   DG Shoulder Left  Result Date: 03/09/2022 CLINICAL DATA:  Left shoulder pain after fall. EXAM: LEFT SHOULDER - 2+ VIEW COMPARISON:  None Available. FINDINGS: There is an acute and comminuted fracture deformity involving the proximal left humerus. Fracture lines involve the surgical neck and greater tuberosity of the proximal left humerus. Fracture fragments are in near anatomic alignment. No signs of dislocation. IMPRESSION: 1. Comminuted fracture deformity involves the proximal left humerus. Electronically Signed   By: 03/11/2022 M.D.   On: 03/09/2022 21:34   DG Humerus Left  Result Date: 03/09/2022 CLINICAL DATA:  Recent fall with left shoulder pain, initial encounter EXAM: LEFT HUMERUS - 2+ VIEW COMPARISON:  None Available. FINDINGS: Comminuted fracture  of the proximal left humerus is noted involving primarily the surgical neck but extending superiorly into the greater tuberosity. No dislocation is seen. Distal humerus appears within normal limits. No soft tissue abnormality is seen. IMPRESSION: Comminuted proximal left humeral fracture as described. Electronically Signed   By: Inez Catalina M.D.   On: 03/09/2022 21:33    Procedures Procedures    Medications Ordered in ED Medications  fentaNYL (SUBLIMAZE) injection 25 mcg (25 mcg Intravenous Patient Refused/Not Given 03/09/22 2321)  acetaminophen (TYLENOL) tablet 1,000 mg (1,000 mg Oral Given 03/09/22 2208)  ketorolac (TORADOL) 15 MG/ML injection 15 mg (15 mg Intravenous Given 03/09/22 2323)  ondansetron (ZOFRAN) injection 4 mg (4 mg Intravenous Given 03/09/22 2322)  HYDROcodone-acetaminophen (NORCO/VICODIN) 5-325 MG per tablet 1 tablet (1 tablet Oral Given 03/10/22 0011)    ED Course/ Medical Decision Making/ A&P Clinical Course as of 03/10/22 0053  Sat Mar 10, 2022  0051 Patient feeling improved after oral Vicodin and Toradol.  She is requesting discharge home.  Shoulder immobilizer provided.  will follow-up with orthopedics next week.  No other acute traumatic injury is noted [DW]    Clinical Course User Index [DW] Ripley Fraise, MD         Glasgow Coma Scale Score: 15                  Medical Decision Making Amount and/or Complexity of Data Reviewed Radiology: ordered.  Risk Prescription drug management.   Patient presents after accidental fall injuring her left shoulder.  Patient has left humerus fracture, no dislocation Patient pain controlled She feels comfortable for discharge home.        Final Clinical Impression(s) / ED Diagnoses Final diagnoses:  Other closed displaced fracture of proximal end of left humerus, initial encounter    Rx / DC Orders ED Discharge Orders          Ordered    HYDROcodone-acetaminophen (NORCO/VICODIN) 5-325 MG tablet  Every 6 hours PRN        03/10/22 0044              Ripley Fraise, MD 03/10/22 [phone]

## 2024-07-29 ENCOUNTER — Other Ambulatory Visit: Payer: Self-pay

## 2024-07-29 ENCOUNTER — Emergency Department (HOSPITAL_BASED_OUTPATIENT_CLINIC_OR_DEPARTMENT_OTHER)
Admission: EM | Admit: 2024-07-29 | Discharge: 2024-07-29 | Disposition: A | Discharge status: Home / Self Care | Attending: Emergency Medicine | Admitting: Emergency Medicine

## 2024-07-29 ENCOUNTER — Other Ambulatory Visit (HOSPITAL_BASED_OUTPATIENT_CLINIC_OR_DEPARTMENT_OTHER): Payer: Self-pay

## 2024-07-29 ENCOUNTER — Encounter (HOSPITAL_BASED_OUTPATIENT_CLINIC_OR_DEPARTMENT_OTHER): Payer: Self-pay | Admitting: *Deleted

## 2024-07-29 DIAGNOSIS — R110 Nausea: Secondary | ICD-10-CM

## 2024-07-29 DIAGNOSIS — J40 Bronchitis, not specified as acute or chronic: Secondary | ICD-10-CM | POA: Insufficient documentation

## 2024-07-29 DIAGNOSIS — F17200 Nicotine dependence, unspecified, uncomplicated: Secondary | ICD-10-CM | POA: Insufficient documentation

## 2024-07-29 DIAGNOSIS — R630 Anorexia: Secondary | ICD-10-CM | POA: Insufficient documentation

## 2024-07-29 DIAGNOSIS — R000 Tachycardia, unspecified: Secondary | ICD-10-CM | POA: Insufficient documentation

## 2024-07-29 DIAGNOSIS — I889 Nonspecific lymphadenitis, unspecified: Secondary | ICD-10-CM | POA: Insufficient documentation

## 2024-07-29 MED ORDER — DOXYCYCLINE HYCLATE 100 MG PO CAPS
100.0000 mg | ORAL_CAPSULE | Freq: Two times a day (BID) | ORAL | 0 refills | Status: AC
  Filled 2024-07-29: qty 10, 5d supply, fill #0

## 2024-07-29 MED ORDER — DM-GUAIFENESIN ER 30-600 MG PO TB12
1.0000 | ORAL_TABLET | Freq: Two times a day (BID) | ORAL | 0 refills | Status: AC
  Filled 2024-07-29: qty 20, 10d supply, fill #0

## 2024-07-29 MED ORDER — ONDANSETRON 4 MG PO TBDP
4.0000 mg | ORAL_TABLET | Freq: Three times a day (TID) | ORAL | 0 refills | Status: AC | PRN
  Filled 2024-07-29: qty 20, 7d supply, fill #0

## 2024-07-29 NOTE — ED Notes (Signed)
 Patient ambulated around unit without any notable SHOB. She ambulated at a fast pace.  Patient was 99% on room air, HR 84 prior to ambulating. Post ambulation her SPO2 was 96% HR 88 RR 20. No distress noted.

## 2024-07-29 NOTE — ED Notes (Signed)
 Respiratory therapist at bedside.

## 2024-07-29 NOTE — ED Notes (Signed)
 Reviewed AVS/discharge instruction with patient. Time allotted for and all questions answered. Patient is agreeable for d/c and escorted to ed exit by staff.

## 2024-07-29 NOTE — ED Triage Notes (Signed)
 Pt reports that she had the flu before christmas and had lots of URI symptoms with coughing and then MLK day she got another strain of the flu and she continues to have URI and feels exhausted.  She has had a CXR yesterday and was told it was wnl.

## 2024-07-29 NOTE — Discharge Instructions (Addendum)
 Thank for letting us  evaluate you today.  Your chest x-ray from yesterday did not show any pneumonia nor fluid.  I have sent Mucinex  DM for cough, congestion, Zofran  for nausea, and antibiotic to cover for bacterial source of infection.  Symptoms are likely secondary to postviral syndromes from influenza.  Please make sure to drink plenty of water and eat appropriately to help with your fatigue.  Return to Emergency Department for experience chest pain, shortness of breath, worsening symptoms

## 2024-07-29 NOTE — ED Provider Notes (Signed)
 " [place] EMERGENCY DEPARTMENT AT [facility] Provider Note   CSN: [id number] Arrival date & time: 07/29/24  1358     Patient presents with: Influenza   [name] is a 64 y.o. female with past medical history of tobacco abuse presents emergency department for evaluation of cough, congestion, generalized weakness, fatigue, nausea that has been occurring consistently since 07/13/2024.  She was diagnosed with flu for last 2 weeks of December.  Has been using Mucinex , Tessalon Perles without improvement of symptoms.  Has had decreased appetite over past two weeks 2/2 nausea but has been keeping hydrated. Also complains of bilateral tender axillary lymph nodes that she noticed yesterday. Was evaluated at urgent care on 07/22/2024 and yesterday for similar symptoms.  Had chest x-ray yesterday that was negative for pneumonia.  Scheduled to establish care with PCP in March.  Denies recent fever, chills, CP, SHOB, wheezing    Influenza Associated symptoms: nasal congestion       Prior to Admission medications  Medication Sig Start Date End Date Taking? Authorizing Provider  dextromethorphan -guaiFENesin  (MUCINEX  DM) 30-600 MG 12hr tablet Take 1 tablet by mouth 2 (two) times daily for 10 days. 07/29/24 08/08/24 Yes Minnie Tinnie BRAVO, PA  ondansetron  (ZOFRAN -ODT) 4 MG disintegrating tablet Take 1 tablet (4 mg total) by mouth every 8 (eight) hours as needed for nausea or vomiting. 07/29/24  Yes Minnie Tinnie BRAVO, PA  HYDROcodone -acetaminophen  (NORCO/VICODIN) 5-325 MG tablet Take 1 tablet by mouth every 6 (six) hours as needed for severe pain. 03/10/22   Midge Golas, MD    Allergies: Codeine and Penicillin g    Review of Systems  HENT:  Positive for congestion.     Updated Vital Signs BP 122/78 (BP Location: Right Arm)   Pulse 77   Temp 98 F (36.7 C)   Resp 18   SpO2 99%   Physical Exam Vitals and nursing note reviewed.  Constitutional:      General: She is not in acute  distress.    Appearance: Normal appearance. She is not ill-appearing.  HENT:     Head: Normocephalic and atraumatic.     Right Ear: Tympanic membrane, ear canal and external ear normal.     Left Ear: Tympanic membrane, ear canal and external ear normal.     Mouth/Throat:     Mouth: Mucous membranes are moist.     Pharynx: No oropharyngeal exudate or posterior oropharyngeal erythema.     Comments: Uvula midline. No abscess, fluctuance, erythema noted in mouth Eyes:     General: No scleral icterus.       Right eye: No discharge.        Left eye: No discharge.     Conjunctiva/sclera: Conjunctivae normal.  Cardiovascular:     Rate and Rhythm: Tachycardia present.     Pulses: Normal pulses.  Pulmonary:     Effort: Pulmonary effort is normal. No respiratory distress.     Breath sounds: Normal breath sounds. No stridor. No wheezing or rhonchi.  Chest:     Chest wall: No tenderness.  Abdominal:     General: There is no distension.     Palpations: Abdomen is soft. There is no mass.     Tenderness: There is no abdominal tenderness. There is no guarding.  Musculoskeletal:     Cervical back: Normal range of motion and neck supple. No rigidity or tenderness.  Lymphadenopathy:     Cervical: No cervical adenopathy.  Skin:    General: Skin is warm.  Capillary Refill: Capillary refill takes less than 2 seconds.     Coloration: Skin is not jaundiced or pale.  Neurological:     Mental Status: She is alert and oriented to person, place, and time. Mental status is at baseline.     (all labs ordered are listed, but only abnormal results are displayed) Labs Reviewed - No data to display  EKG: None  Radiology: No results found.   Medications Ordered in the ED - No data to display                                  Medical Decision Making  Patient presents to the ED for concern of cough, congestion, this involves an extensive number of treatment options, and is a complaint that  carries with it a high risk of complications and morbidity.  The differential diagnosis includes COVID, flu, RSV,    Co morbidities that complicate the patient evaluation  Current smoker   Additional history obtained:  Additional history obtained from Nursing and Outside Medical Records   External records from outside source obtained and reviewed including triage RN note, UC from 07/22/24, UC from 07/28/24    Medicines ordered and prescription drug management:  I ordered prescription medication including zofran , mucinex  DM for NV  I have reviewed the patients home medicines and have made adjustments as needed    Problem List / ED Course:  Bronchitis  Lymphadenitis Vital signs without fever no tachycardia She denies any recent fever, chills, CP, SHOB I offered patient lab work, respiratory panel however patient declines at this time.  She can follow-up with obtaining labs when she establishes care with her PCP in March.  This is reasonable as she has not been having vomiting and unlikely to have significant electrolyte abnormalities Patient's fatigue is likely secondary to nausea, decreased appetite, post viral syndrome.  She reports she has been eating small snacks throughout the day No fluctuance nor surrounding cellulitis to lymph nodes bilaterally No desaturation, tachypnea, signs of resp distress, wheezing, nor tachycardia with ambulation Provided short course of doxy for bronchitis to cover bacterial source and lymphadenitis Also provided mucinex  DM for cough, congestion Patient has f/u with PCP in march and can f/u with them for further management  Nausea Passed po Provided zofran  prescription   Reevaluation:  After the interventions noted above, I reevaluated the patient and found that they have :stayed the same     Dispostion:  After consideration of the diagnostic results and the patients response to treatment, I feel that the patent would benefit from  outpatient management with symptomatic tx.   Discussed ED workup, disposition, return to ED precautions with patient who expresses understanding agrees with plan.  All questions answered to their satisfaction.  They are agreeable to plan.  Discharge instructions provided on paperwork  Final diagnoses:  Bronchitis  Nausea  Decreased appetite    ED Discharge Orders          Ordered    dextromethorphan -guaiFENesin  (MUCINEX  DM) 30-600 MG 12hr tablet  2 times daily        07/29/24 1530    ondansetron  (ZOFRAN -ODT) 4 MG disintegrating tablet  Every 8 hours PRN        07/29/24 1530             Minnie Tinnie BRAVO, PA 07/29/24 1543    Jerrol Agent, MD 07/29/24 1613  "
